# Patient Record
Sex: Female | Born: 1985 | Race: Black or African American | Hispanic: No | Smoking: Never smoker
Health system: Southern US, Community
[De-identification: ages and names within clinical notes are randomized; demographics above are authoritative.]

## PROBLEM LIST (undated history)

## (undated) DIAGNOSIS — B009 Herpesviral infection, unspecified: Secondary | ICD-10-CM

---

## 2015-11-06 ENCOUNTER — Encounter (HOSPITAL_BASED_OUTPATIENT_CLINIC_OR_DEPARTMENT_OTHER): Payer: Self-pay

## 2015-11-06 ENCOUNTER — Other Ambulatory Visit (HOSPITAL_BASED_OUTPATIENT_CLINIC_OR_DEPARTMENT_OTHER): Payer: BLUE CROSS/BLUE SHIELD

## 2015-11-06 ENCOUNTER — Encounter: Payer: Self-pay | Admitting: *Deleted

## 2015-11-06 ENCOUNTER — Emergency Department
Admission: EM | Admit: 2015-11-06 | Discharge: 2015-11-08 | Disposition: A | Discharge status: Home / Self Care | Payer: BLUE CROSS/BLUE SHIELD | Source: Home / Self Care | Attending: Family Medicine | Admitting: Family Medicine

## 2015-11-06 ENCOUNTER — Ambulatory Visit (HOSPITAL_BASED_OUTPATIENT_CLINIC_OR_DEPARTMENT_OTHER)
Admission: RE | Admit: 2015-11-06 | Discharge: 2015-11-06 | Disposition: A | Discharge status: Home / Self Care | Payer: BLUE CROSS/BLUE SHIELD | Source: Ambulatory Visit | Attending: Family Medicine | Admitting: Family Medicine

## 2015-11-06 ENCOUNTER — Ambulatory Visit (HOSPITAL_BASED_OUTPATIENT_CLINIC_OR_DEPARTMENT_OTHER)
Admit: 2015-11-06 | Discharge: 2015-11-06 | Disposition: A | Discharge status: Home / Self Care | Payer: BLUE CROSS/BLUE SHIELD | Attending: Family Medicine | Admitting: Family Medicine

## 2015-11-06 DIAGNOSIS — D270 Benign neoplasm of right ovary: Secondary | ICD-10-CM

## 2015-11-06 DIAGNOSIS — D259 Leiomyoma of uterus, unspecified: Secondary | ICD-10-CM | POA: Insufficient documentation

## 2015-11-06 DIAGNOSIS — E279 Disorder of adrenal gland, unspecified: Secondary | ICD-10-CM | POA: Diagnosis not present

## 2015-11-06 DIAGNOSIS — D271 Benign neoplasm of left ovary: Secondary | ICD-10-CM

## 2015-11-06 DIAGNOSIS — R1032 Left lower quadrant pain: Secondary | ICD-10-CM | POA: Insufficient documentation

## 2015-11-06 DIAGNOSIS — R1900 Intra-abdominal and pelvic swelling, mass and lump, unspecified site: Secondary | ICD-10-CM | POA: Diagnosis not present

## 2015-11-06 HISTORY — DX: Herpesviral infection, unspecified: B00.9

## 2015-11-06 LAB — POCT CBC W AUTO DIFF (K'VILLE URGENT CARE)

## 2015-11-06 LAB — POCT URINALYSIS DIP (MANUAL ENTRY)
Bilirubin, UA: NEGATIVE
Glucose, UA: NEGATIVE
Ketones, POC UA: NEGATIVE
Nitrite, UA: NEGATIVE
Protein Ur, POC: NEGATIVE
Spec Grav, UA: 1.015 (ref 1.005–1.03)
Urobilinogen, UA: 0.2 (ref 0–1)
pH, UA: 8.5 (ref 5–8)

## 2015-11-06 LAB — POCT URINE PREGNANCY: Preg Test, Ur: NEGATIVE

## 2015-11-06 MED ORDER — KETOROLAC TROMETHAMINE 60 MG/2ML IM SOLN
60.0000 mg | Freq: Once | INTRAMUSCULAR | Status: AC
  Administered 2015-11-06: 60 mg via INTRAMUSCULAR

## 2015-11-06 MED ORDER — CYCLOBENZAPRINE HCL 10 MG PO TABS: 10.0000 mg | ORAL_TABLET | Freq: Two times a day (BID) | ORAL | Status: AC | PRN

## 2015-11-06 MED ORDER — NAPROXEN 500 MG PO TABS: 500.0000 mg | ORAL_TABLET | Freq: Two times a day (BID) | ORAL | Status: AC

## 2015-11-06 MED ORDER — IOPAMIDOL (ISOVUE-300) INJECTION 61%
100.0000 mL | Freq: Once | INTRAVENOUS | Status: AC | PRN
  Administered 2015-11-06: 100 mL via INTRAVENOUS

## 2015-11-06 NOTE — ED Notes (Signed)
Pt c/o LLQ abd pain since 9pm last night. Pain is constant and dull. Denies N/V/D, fever or vaginal bleeding or discharge. Taken gasex, motrin and tylenol with out relief.

## 2015-11-06 NOTE — ED Provider Notes (Signed)
CSN: [id number]     Arrival date & time 11/06/15  1502 History   First MD Initiated Contact with Patient 11/06/15 1522     Chief Complaint  Patient presents with  . Abdominal Pain   (Consider location/radiation/quality/duration/timing/severity/associated sxs/prior Treatment) HPI The pt is a 30yo female presenting to [facility] with c/o sudden onset LLQ abdominal pain that started last night around 9PM and has been constant but waxing and waning in severity since onset. Pain is dull "like contractions almost." Pain is 7/10 at worst. She took Tylenol, motrin, and gas-x last night but no relief.  Pt is sexually active and on birth control.  Low concern for pregnancy but states its always a possibility.  Denies hx of similar pain. Denies hx of ovarian cysts or fibroids. Denies fever, chills, n/v/d. Denies urinary or vaginal symptoms.  Denies hx of kidney stones or abdominal surgeries.    Past Medical History  Diagnosis Date  . Herpes    History reviewed. No pertinent past surgical history. History reviewed. No pertinent family history. Social History  Substance Use Topics  . Smoking status: Never Smoker   . Smokeless tobacco: Never Used  . Alcohol Use: No   OB History    No data available     Review of Systems  Constitutional: Negative for fever and chills.  Cardiovascular: Negative for chest pain and palpitations.  Gastrointestinal: Positive for abdominal pain (LLQ). Negative for nausea, vomiting and diarrhea.  Genitourinary: Negative for dysuria, urgency, frequency, hematuria, flank pain, vaginal bleeding, vaginal discharge, vaginal pain and pelvic pain.  Musculoskeletal: Negative for myalgias, back pain and arthralgias.  Skin: Negative for rash.    Allergies  Review of patient's allergies indicates no known allergies.  Home Medications   Prior to Admission medications   Medication Sig Start Date End Date Taking? Authorizing Provider  norethindrone-ethinyl estradiol-iron (ESTROSTEP  FE,TILIA FE,TRI-LEGEST FE) 1-20/1-30/1-35 MG-MCG tablet Take 1 tablet by mouth daily.   Yes Historical Provider, MD  cyclobenzaprine (FLEXERIL) 10 MG tablet Take 1 tablet (10 mg total) by mouth 2 (two) times daily as needed for muscle spasms. 11/06/15   Noland Fordyce, PA-C  naproxen (NAPROSYN) 500 MG tablet Take 1 tablet (500 mg total) by mouth 2 (two) times daily. 11/06/15   Noland Fordyce, PA-C   Meds Ordered and Administered this Visit   Medications  ketorolac (TORADOL) injection 60 mg (60 mg Intramuscular Given 11/06/15 1547)    BP 121/78 mmHg  Pulse 71  Temp(Src) 98.3 F (36.8 C) (Oral)  Resp 16  Ht 5\' 6"  (1.676 m)  Wt 191 lb (86.637 kg)  BMI 30.84 kg/m2  SpO2 99%  LMP 09/30/2015 No data found.   Physical Exam  Constitutional: She appears well-developed and well-nourished. She appears distressed.  Pt sitting on exam bed, appears mildly uncomfortable, rubbing LLQ  HENT:  Head: Normocephalic and atraumatic.  Eyes: Conjunctivae are normal. No scleral icterus.  Neck: Normal range of motion.  Cardiovascular: Normal rate, regular rhythm and normal heart sounds.   Pulmonary/Chest: Effort normal and breath sounds normal. No respiratory distress. She has no wheezes. She has no rales. She exhibits no tenderness.  Abdominal: Soft. Bowel sounds are normal. She exhibits no distension and no mass. There is tenderness. There is no rigidity, no rebound, no guarding and no CVA tenderness.    Soft, non-distended. Tenderness to LLQ with mild guarding.    Musculoskeletal: Normal range of motion.  Neurological: She is alert.  Skin: Skin is warm and dry. She is  not diaphoretic.  Nursing note and vitals reviewed.   ED Course  Procedures (including critical care time)  Labs Review Labs Reviewed  POCT URINALYSIS DIP (MANUAL ENTRY) - Abnormal; Notable for the following:    Blood, UA trace-intact (*)    Leukocytes, UA Trace (*)    All other components within normal limits  URINE CULTURE    POCT URINE PREGNANCY  POCT CBC W AUTO DIFF ([facility])    Imaging Review US Transvaginal Non-ob  11/06/2015  CLINICAL DATA:  Large pelvic mass suspected to be a dermoid lesion. Evaluate for ovarian torsion EXAM: TRANSABDOMINAL AND TRANSVAGINAL ULTRASOUND OF PELVIS DOPPLER ULTRASOUND OF OVARIES TECHNIQUE: Both transabdominal and transvaginal ultrasound examinations of the pelvis were performed. Transabdominal technique was performed for global imaging of the pelvis including uterus, ovaries, adnexal regions, and pelvic cul-de-sac. It was necessary to proceed with endovaginal exam following the transabdominal exam to visualize the ovaries. Color and duplex Doppler ultrasound was utilized to evaluate blood flow to the ovaries. COMPARISON:  CT 11/06/2015 FINDINGS: Uterus Measurements: Normal in size at 8.1 x 4 point 5 x 5.3 cm. No leiomyoma identified. Endometrium Thickness: Normal thickness for premenopausal female at 6 mm. Right ovary Measurements: Normal in size with small follicles measuring 3.3 x 1.6 x 2.8 cm. The RIGHT adnexal dermoid lesion described on comparison CT is not well identified. There is normal arterial and venous waveforms in the RIGHT ovary. Left ovary Measurements: There is a large mass associated with the LEFT ovary measuring 9.6 x 7.0 x 9.4 cm. This mass is homogeneous and hyperechoic centrally with a linear hypoechoic region. There thinned ovarian tissue associated with the mass superiorly. There is a blood flow identified with the superior ovarian tissue. Normal arterial and venous waveforms associated with the ovarian tissue. Pulsed Doppler evaluation of both ovaries demonstrates normal low-resistance arterial and venous waveforms. Other findings No free fluid IMPRESSION: 1. Large homogeneous mass associated with the LEFT adnexa remains most suspicious for a dermoid neoplasm. 2. Normal small RIGHT ovary identified. The smaller dermoid lesion in the RIGHT adnexa is not well  identified by ultrasound. 3. Normal arterial and venous spectral waveforms associated with the normal RIGHT ovary the thinned LEFT ovarian tissue. No evidence of torsion. Electronically Signed   By: Suzy Bouchard M.D.   On: 11/06/2015 19:58   US Pelvis Complete  11/06/2015  CLINICAL DATA:  Large pelvic mass suspected to be a dermoid lesion. Evaluate for ovarian torsion EXAM: TRANSABDOMINAL AND TRANSVAGINAL ULTRASOUND OF PELVIS DOPPLER ULTRASOUND OF OVARIES TECHNIQUE: Both transabdominal and transvaginal ultrasound examinations of the pelvis were performed. Transabdominal technique was performed for global imaging of the pelvis including uterus, ovaries, adnexal regions, and pelvic cul-de-sac. It was necessary to proceed with endovaginal exam following the transabdominal exam to visualize the ovaries. Color and duplex Doppler ultrasound was utilized to evaluate blood flow to the ovaries. COMPARISON:  CT 11/06/2015 FINDINGS: Uterus Measurements: Normal in size at 8.1 x 4 point 5 x 5.3 cm. No leiomyoma identified. Endometrium Thickness: Normal thickness for premenopausal female at 6 mm. Right ovary Measurements: Normal in size with small follicles measuring 3.3 x 1.6 x 2.8 cm. The RIGHT adnexal dermoid lesion described on comparison CT is not well identified. There is normal arterial and venous waveforms in the RIGHT ovary. Left ovary Measurements: There is a large mass associated with the LEFT ovary measuring 9.6 x 7.0 x 9.4 cm. This mass is homogeneous and hyperechoic centrally with a linear hypoechoic region. There  thinned ovarian tissue associated with the mass superiorly. There is a blood flow identified with the superior ovarian tissue. Normal arterial and venous waveforms associated with the ovarian tissue. Pulsed Doppler evaluation of both ovaries demonstrates normal low-resistance arterial and venous waveforms. Other findings No free fluid IMPRESSION: 1. Large homogeneous mass associated with the LEFT  adnexa remains most suspicious for a dermoid neoplasm. 2. Normal small RIGHT ovary identified. The smaller dermoid lesion in the RIGHT adnexa is not well identified by ultrasound. 3. Normal arterial and venous spectral waveforms associated with the normal RIGHT ovary the thinned LEFT ovarian tissue. No evidence of torsion. Electronically Signed   By: Suzy Bouchard M.D.   On: 11/06/2015 19:58   Ct Abdomen Pelvis W Contrast  11/06/2015  CLINICAL DATA:  LEFT lower quadrant pain which started last evening. Negative pregnancy test. No fever. Increased urination. EXAM: CT ABDOMEN AND PELVIS WITH CONTRAST TECHNIQUE: Multidetector CT imaging of the abdomen and pelvis was performed using the standard protocol following bolus administration of intravenous contrast. CONTRAST:  174mL ISOVUE-300 IOPAMIDOL (ISOVUE-300) INJECTION 61% COMPARISON:  None. FINDINGS: Lower chest: Lung bases are clear. Hepatobiliary: No focal hepatic lesion. No biliary duct dilatation. Gallbladder is normal. Common bile duct is normal. Pancreas: Pancreas is normal. No ductal dilatation. No pancreatic inflammation. Spleen: Normal spleen Adrenals/urinary tract: Adrenal glands and kidneys are normal. The ureters and bladder normal. Stomach/Bowel: Stomach, small bowel, appendix, and cecum are normal. The colon and rectosigmoid colon are normal. Vascular/Lymphatic: Abdominal aorta is normal caliber. There is no retroperitoneal or periportal lymphadenopathy. No pelvic lymphadenopathy. Reproductive: The large round mass anterior to the uterus and compressing the dome of the bladder measuring 7.2 x 9.2 x 8.7 cm. This mass has very low homogeneous internal density and is favored to associated with the LEFT ovary. Centrally within mass there there is wispy higher-density material. Findings are suggestive of a dermoid tumor. Smaller clearly fatty lesion in the RIGHT adnexa region measuring 1.7 x 1.5 cm (image 60, series 2). The uterus is slightly lobular  suggesting leiomyomainvolvement. Uterus and cervix are not well evaluated by CT. Other: No free fluid. Musculoskeletal: No aggressive osseous lesion. IMPRESSION: 1. 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 Recommend non emergent GYN surgical consultation for potential resection 2. Smaller dermoid lesion in the RIGHT ovary measuring less than 2 cm. 3. Lobular uterus leiomyomas. The uterus and cervix are not well evaluated. These results will be called to the ordering clinician or representative by the Radiologist Assistant, and communication documented in the PACS or zVision Dashboard. Electronically Signed   By: Suzy Bouchard M.D.   On: 11/06/2015 18:18   [place] Art/ven Flow Abd Pelv Doppler  11/06/2015  CLINICAL DATA:  Large pelvic mass suspected to be a dermoid lesion. Evaluate for ovarian torsion EXAM: TRANSABDOMINAL AND TRANSVAGINAL ULTRASOUND OF PELVIS DOPPLER ULTRASOUND OF OVARIES TECHNIQUE: Both transabdominal and transvaginal ultrasound examinations of the pelvis were performed. Transabdominal technique was performed for global imaging of the pelvis including uterus, ovaries, adnexal regions, and pelvic cul-de-sac. It was necessary to proceed with endovaginal exam following the transabdominal exam to visualize the ovaries. Color and duplex Doppler ultrasound was utilized to evaluate blood flow to the ovaries. COMPARISON:  CT 11/06/2015 FINDINGS: Uterus Measurements: Normal in size at 8.1 x 4 point 5 x 5.3 cm. No leiomyoma identified. Endometrium Thickness: Normal thickness for premenopausal female at 6 mm. Right ovary Measurements: Normal in size  with small follicles measuring 3.3 x 1.6 x 2.8 cm. The RIGHT adnexal dermoid lesion described on comparison CT is not well identified. There is normal arterial and venous waveforms in the RIGHT ovary. Left ovary Measurements: There is a large mass associated with the LEFT ovary  measuring 9.6 x 7.0 x 9.4 cm. This mass is homogeneous and hyperechoic centrally with a linear hypoechoic region. There thinned ovarian tissue associated with the mass superiorly. There is a blood flow identified with the superior ovarian tissue. Normal arterial and venous waveforms associated with the ovarian tissue. Pulsed Doppler evaluation of both ovaries demonstrates normal low-resistance arterial and venous waveforms. Other findings No free fluid IMPRESSION: 1. Large homogeneous mass associated with the LEFT adnexa remains most suspicious for a dermoid neoplasm. 2. Normal small RIGHT ovary identified. The smaller dermoid lesion in the RIGHT adnexa is not well identified by ultrasound. 3. Normal arterial and venous spectral waveforms associated with the normal RIGHT ovary the thinned LEFT ovarian tissue. No evidence of torsion. Electronically Signed   By: Suzy Bouchard M.D.   On: 11/06/2015 19:58      MDM   1. Dermoid cyst of left ovary   2. Left lower quadrant pain   3. Dermoid cyst of right ovary    Pt is a 30yo female c/o severe LLQ abdominal pain that has been concern since last night.  Tenderness with guarding on exam. DDx: diverticulitis, ectopic pregnancy, ovarian torsion.    UA: trace blood and trace leukocytes Urine preg: Negative CBC: unremarkable   Tx: Toradol 60mg  IM   Pt sent to [facility] for CT abdomen pelvis w/ contrast as CT unavailable at this facility at this time.  Pt states she feels comfortable driving herself POV to [facility].  Vitals: WNL, stable to leave POV.  CT abd: findings as noted above.  Results discussed with pt over the phone. Pt denied any pain relief with Toradol given at [facility].  Recommend pelvic U/S to r/o ovarian torsion.  Advised pt she may check into the emergency department for stronger pain medication.  Pt declined.  Discussed U/S results with pt. NO evidence of ovarian torsion.  Pt does have an OB/GYN. Strongly encouraged pt to f/u early  next week for further evaluation and treatment of ovarian cysts.   Rx: Flexeril and Naproxen  Advised to call [facility] if stronger pain medication needed.   Discussed symptoms that warrant emergent care in the ED. Patient verbalized understanding and agreement with treatment plan.    Noland Fordyce, PA-C 11/07/15 [phone]

## 2015-11-07 ENCOUNTER — Telehealth: Payer: Self-pay | Admitting: Emergency Medicine

## 2015-11-07 MED ORDER — HYDROCODONE-ACETAMINOPHEN 5-325 MG PO TABS: 1.0000 | ORAL_TABLET | Freq: Four times a day (QID) | ORAL | Status: AC | PRN

## 2015-11-08 LAB — URINE CULTURE: Colony Count: 70000

## 2015-11-08 MED ORDER — CEPHALEXIN 500 MG PO CAPS: 500.0000 mg | ORAL_CAPSULE | Freq: Two times a day (BID) | ORAL | Status: AC

## 2017-12-22 IMAGING — US US PELVIS COMPLETE
1 series · 13 of 25 positions shown · non-contrast
Comparison: CT 11/06/2015

CLINICAL DATA: Large pelvic mass suspected to be a dermoid lesion.
Evaluate for ovarian torsion

EXAM:
TRANSABDOMINAL AND TRANSVAGINAL ULTRASOUND OF PELVIS
DOPPLER ULTRASOUND OF OVARIES
TECHNIQUE: Both transabdominal and transvaginal ultrasound examinations of the
pelvis were performed. Transabdominal technique was performed for
global imaging of the pelvis including uterus, ovaries, adnexal
regions, and pelvic cul-de-sac.
It was necessary to proceed with endovaginal exam following the
transabdominal exam to visualize the ovaries. Color and duplex
Doppler ultrasound was utilized to evaluate blood flow to the
ovaries.

[Series 1: us pelvis complete · 0.24mm/px · 13 of 109 slices shown]
[im 1/109]
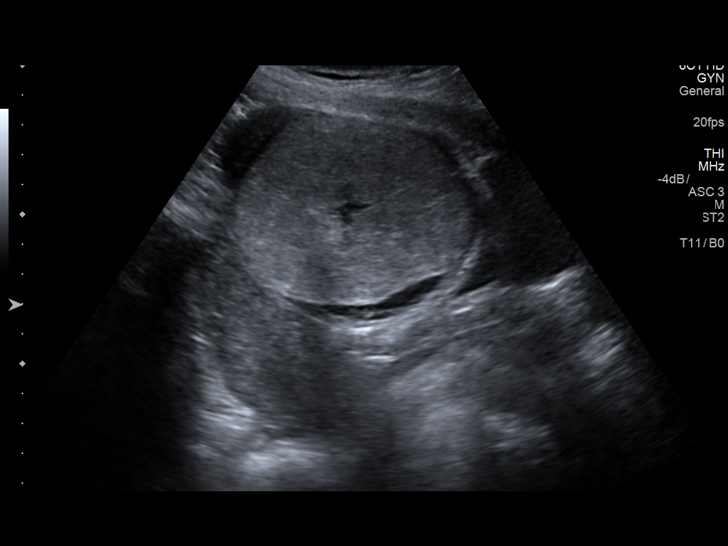
[im 10/109]
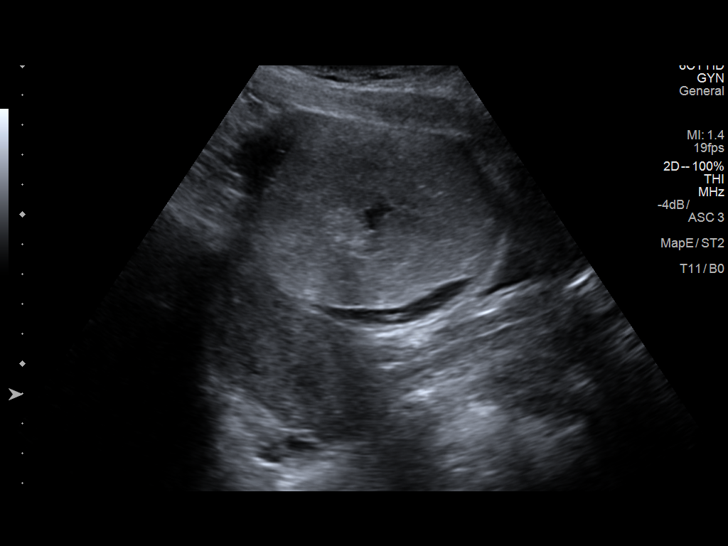
[im 19/109]
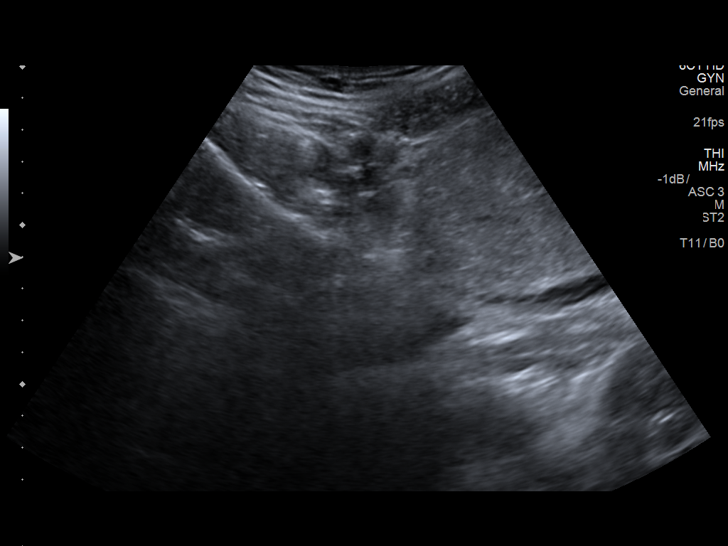
[im 28/109]
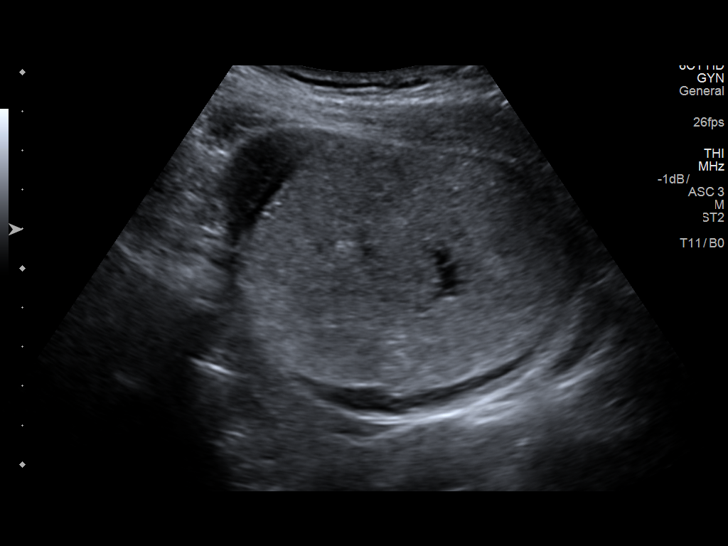
[im 37/109]
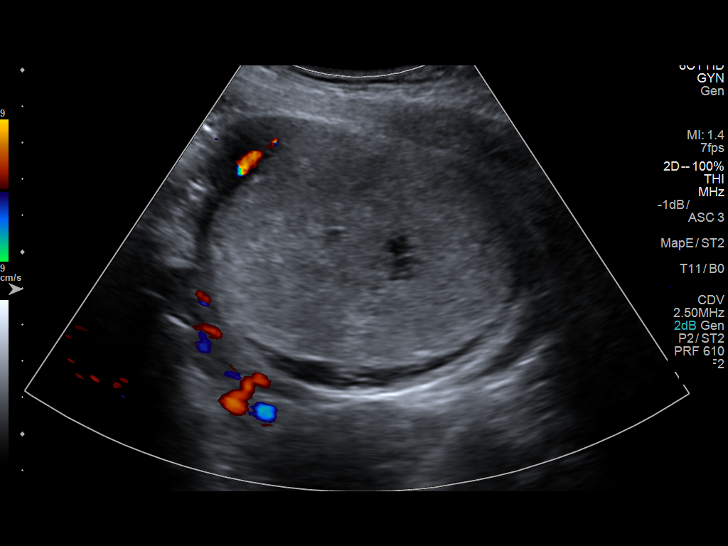
[im 46/109]
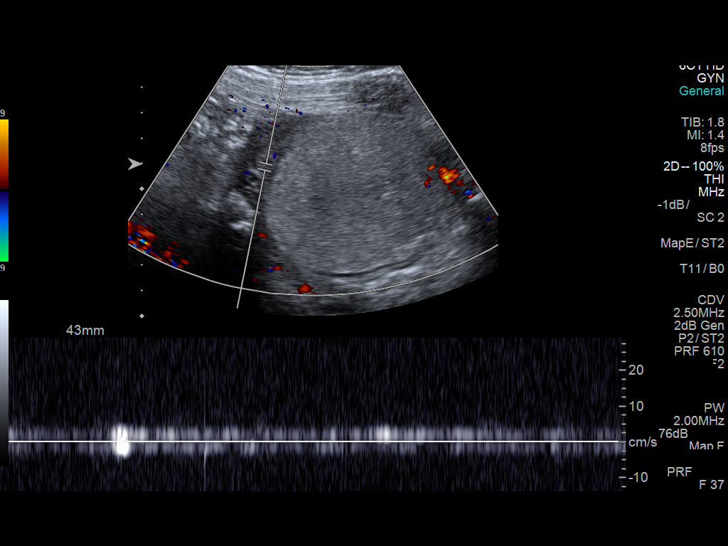
[im 55/109]
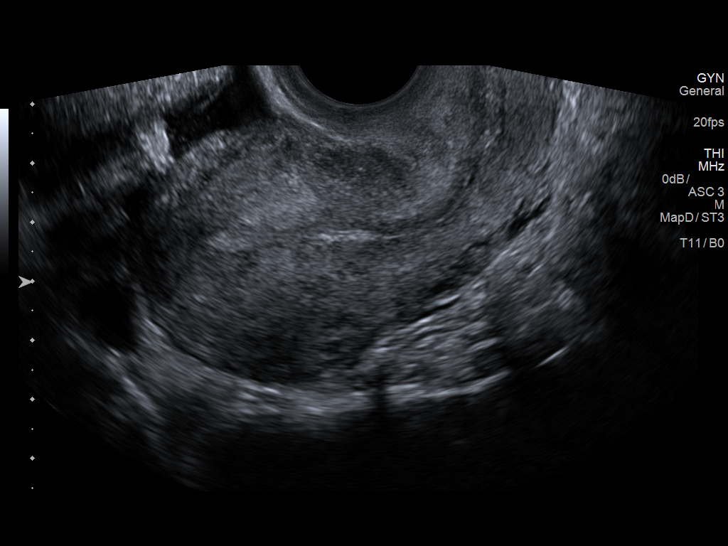
[im 64/109]
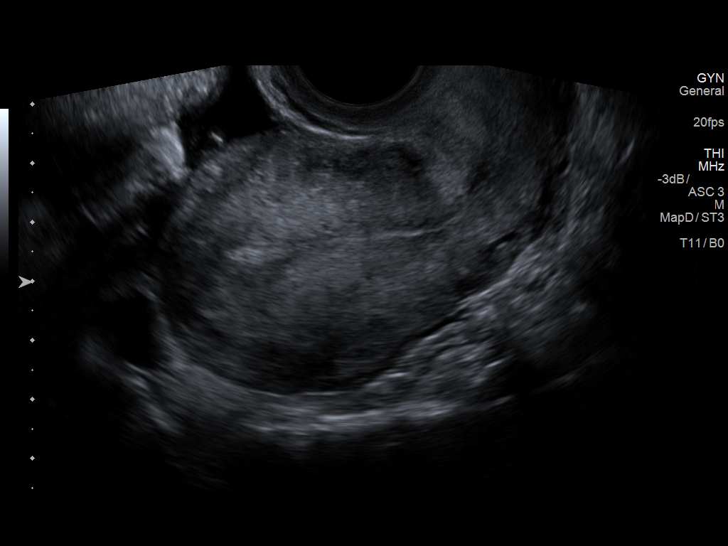
[im 73/109]
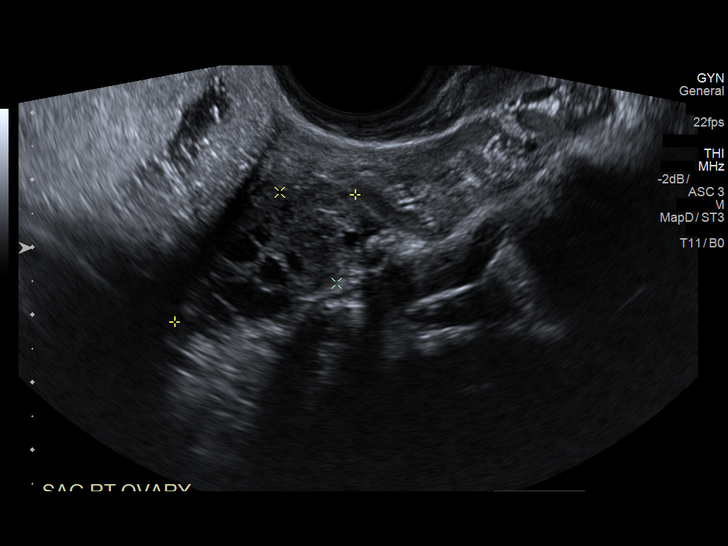
[im 82/109]
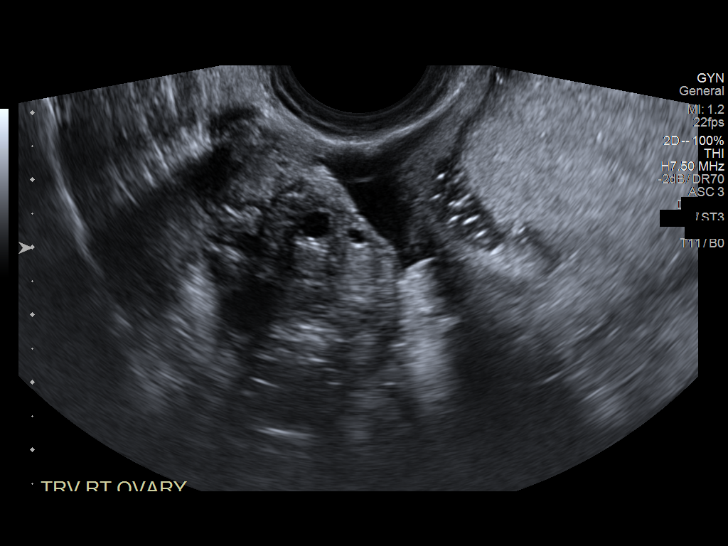
[im 91/109]
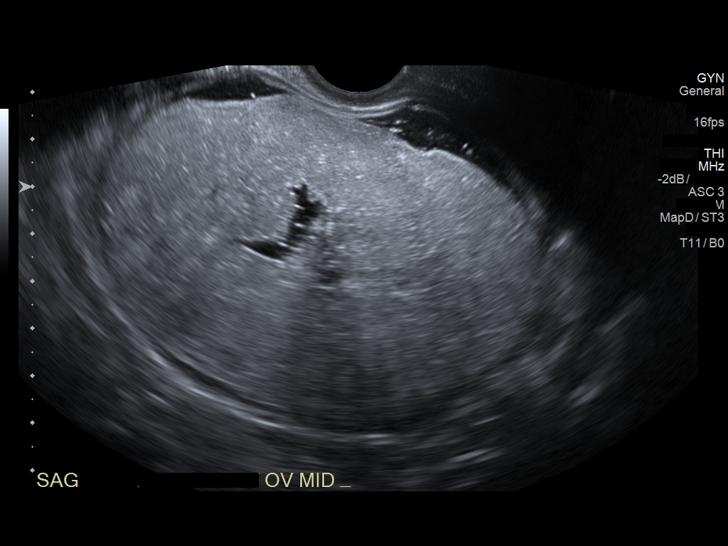
[im 100/109]
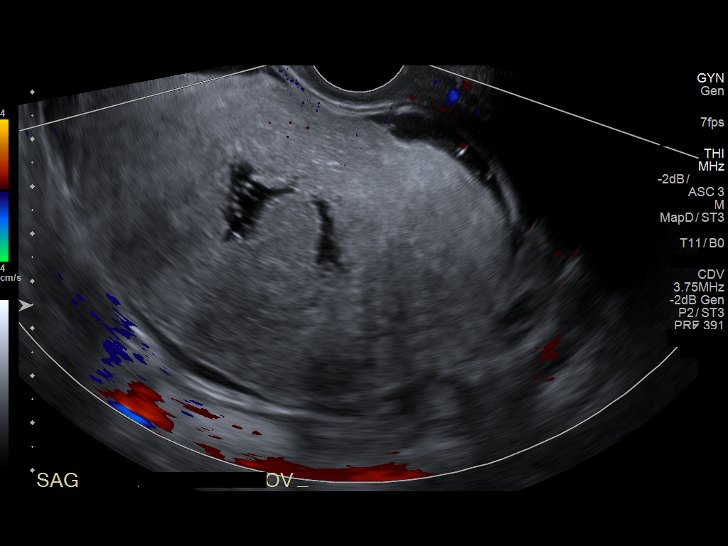
[im 109/109]
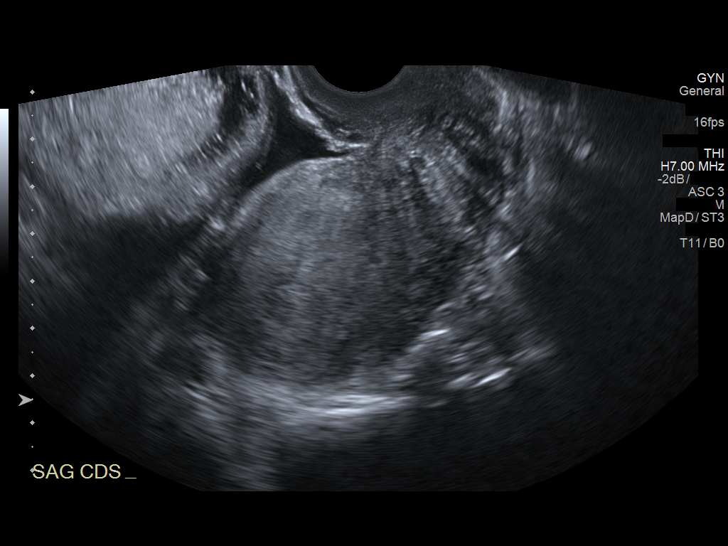

[13 of 25 positions shown; findings below may reference images not displayed]

FINDINGS: Uterus

Measurements: Normal in size at 8.1 x 4 point 5 x 5.3 cm. No
leiomyoma identified..

Endometrium

Thickness: Normal thickness for premenopausal female at 6 mm..

Right ovary

Measurements: Normal in size with small follicles measuring 3.3 x
1.6 x 2.8 cm. The RIGHT adnexal dermoid lesion described on
comparison CT is not well identified.

There is normal arterial and venous waveforms in the RIGHT ovary..

Left ovary

Measurements: There is a large mass associated with the LEFT ovary
measuring 9.6 x 7.0 x 9.4 cm. This mass is homogeneous and
hyperechoic centrally with a linear hypoechoic region. There thinned
ovarian tissue associated with the mass superiorly. There is a blood
flow identified with the superior ovarian tissue. Normal arterial
and venous waveforms associated with the ovarian tissue..

Pulsed Doppler evaluation of both ovaries demonstrates normal
low-resistance arterial and venous waveforms.

Other findings

No free fluid
IMPRESSION: 1. Large homogeneous mass associated with the LEFT adnexa remains
most suspicious for a dermoid neoplasm.
2. Normal small RIGHT ovary identified. The smaller dermoid lesion
in the RIGHT adnexa is not well identified by ultrasound.
3. Normal arterial and venous spectral waveforms associated with the
normal RIGHT ovary the thinned LEFT ovarian tissue. No evidence of
torsion.
